# Patient Record
Sex: Female | Born: 2000 | Race: White | Hispanic: Yes | Marital: Single | Smoking: Never smoker
Health system: Southern US, Community
[De-identification: ages and names within clinical notes are randomized; demographics above are authoritative.]

## PROBLEM LIST (undated history)

## (undated) DIAGNOSIS — T7840XA Allergy, unspecified, initial encounter: Secondary | ICD-10-CM

## (undated) HISTORY — DX: Allergy, unspecified, initial encounter: T78.40XA

---
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
## 2020-05-06 ENCOUNTER — Telehealth: Payer: 59 | Admitting: Family Medicine

## 2020-05-06 IMAGING — US US THYROID
1 series · 14 of 25 positions shown · non-contrast
Comparison: 05/27/2017

CLINICAL DATA: Hypothyroid.

EXAM:
THYROID ULTRASOUND
TECHNIQUE: Ultrasound examination of the thyroid gland and adjacent soft
tissues was performed.

[Series 1: us thyroid · 0.07mm/px · 14 of 33 slices shown]
[im 1/33]
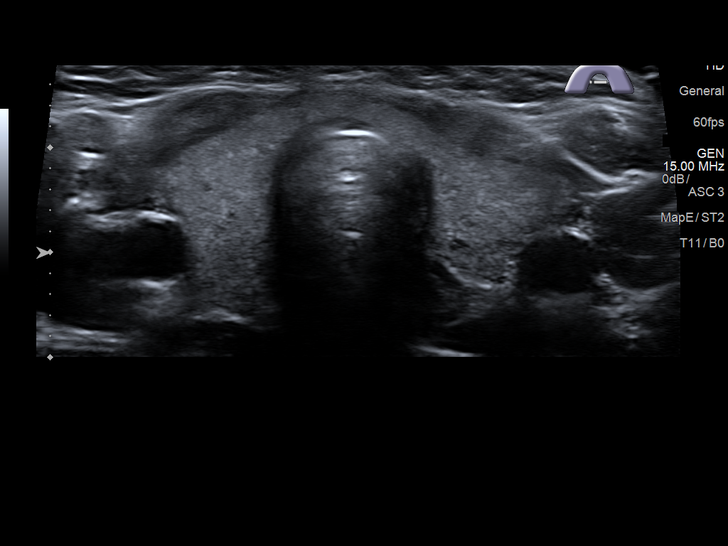
[im 3/33]
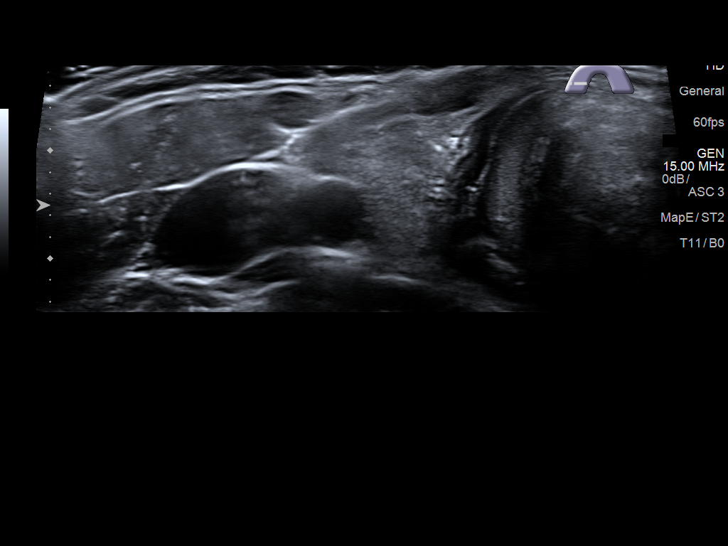
[im 6/33]
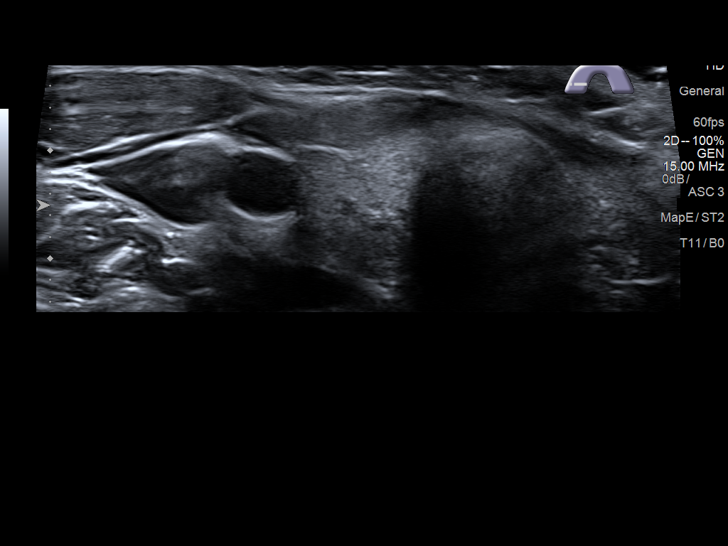
[im 9/33]
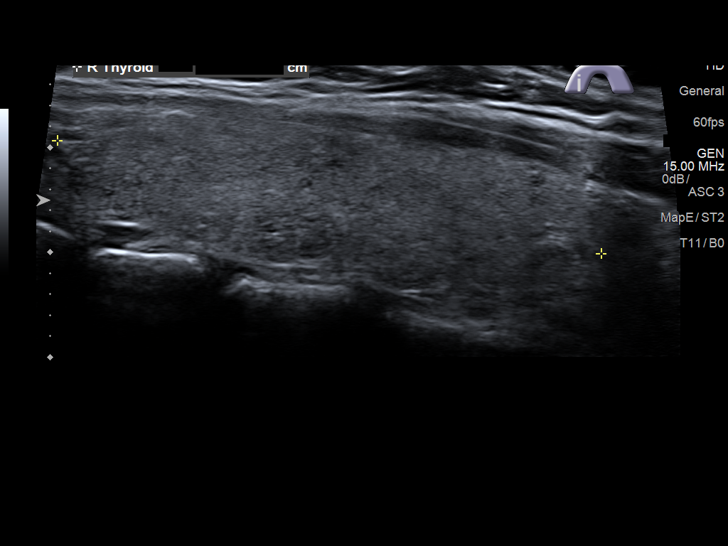
[im 11/33]
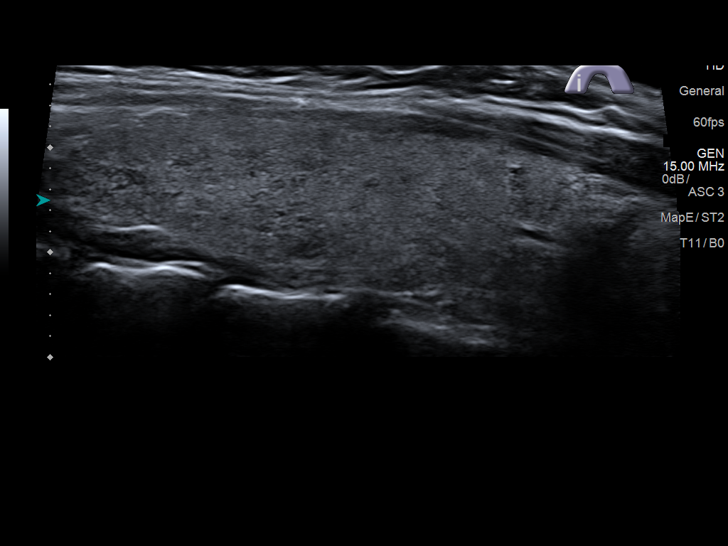
[im 13/33]
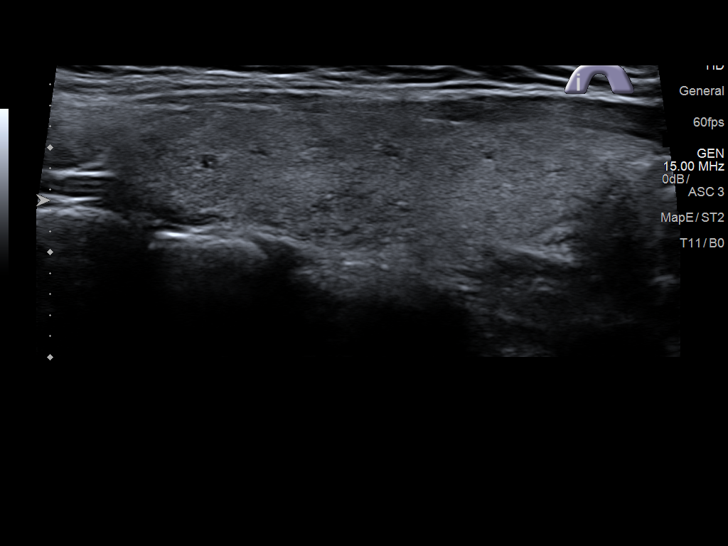
[im 15/33]
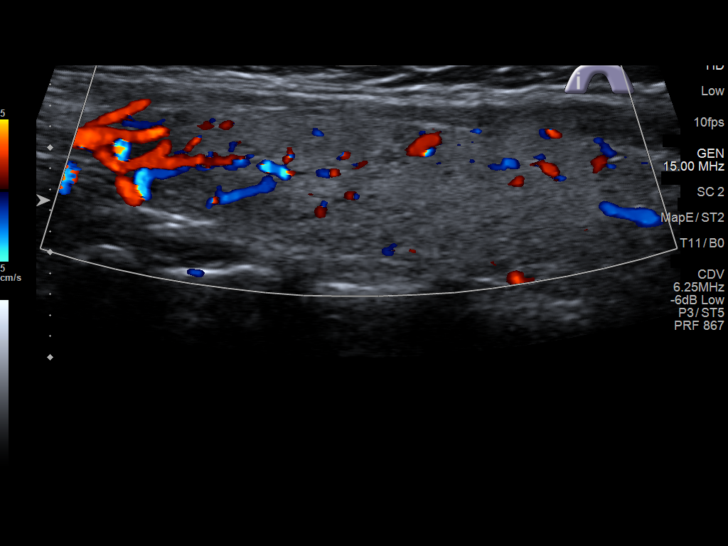
[im 18/33]
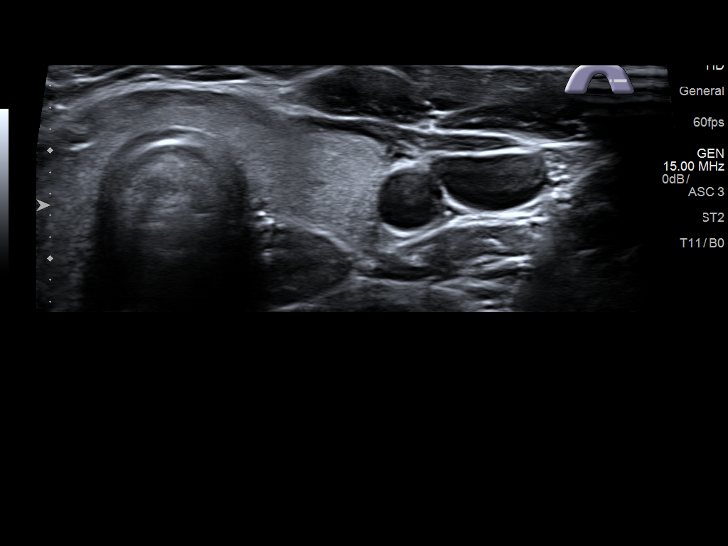
[im 21/33]
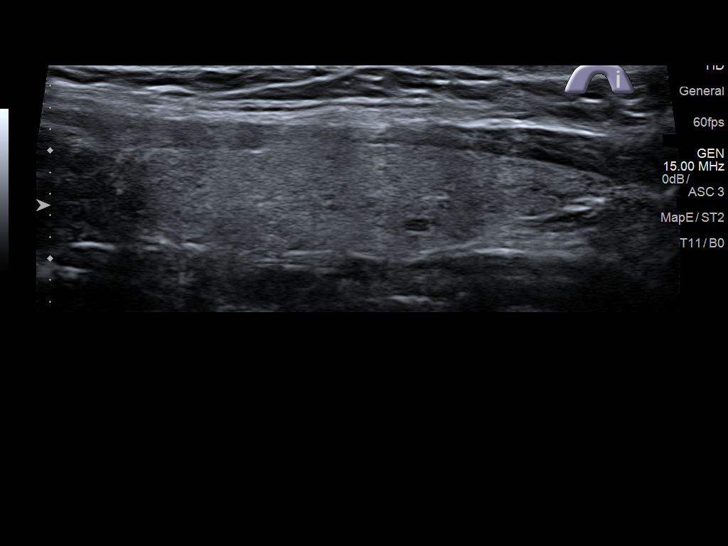
[im 22/33]
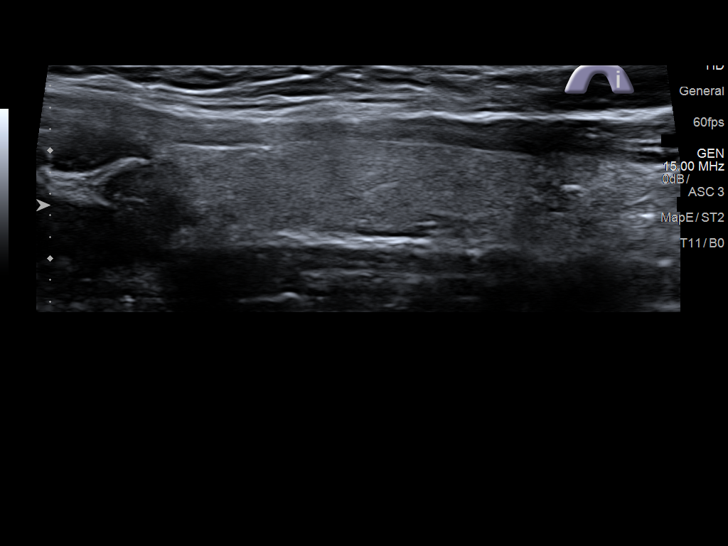
[im 25/33]
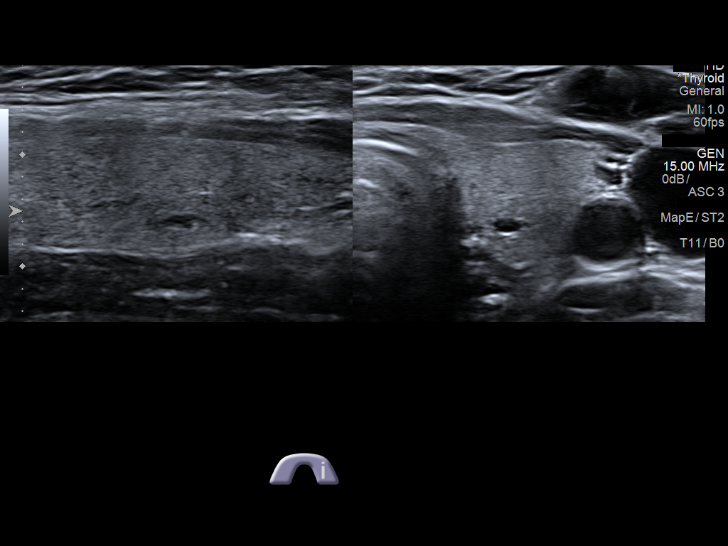
[im 27/33]
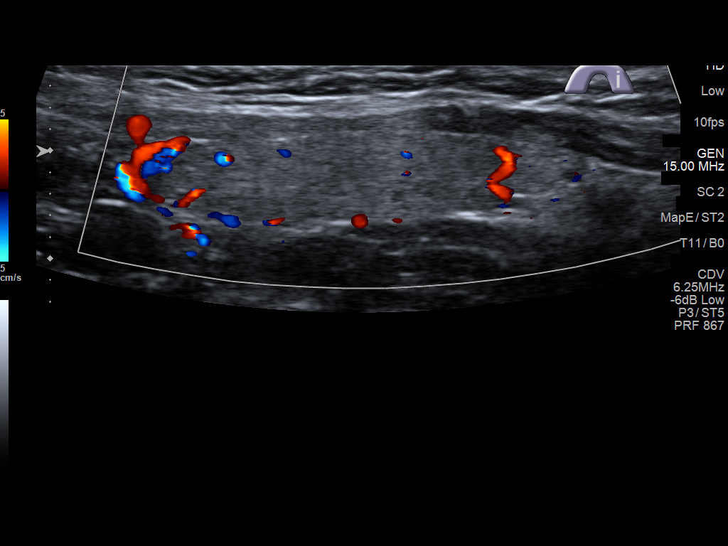
[im 30/33]
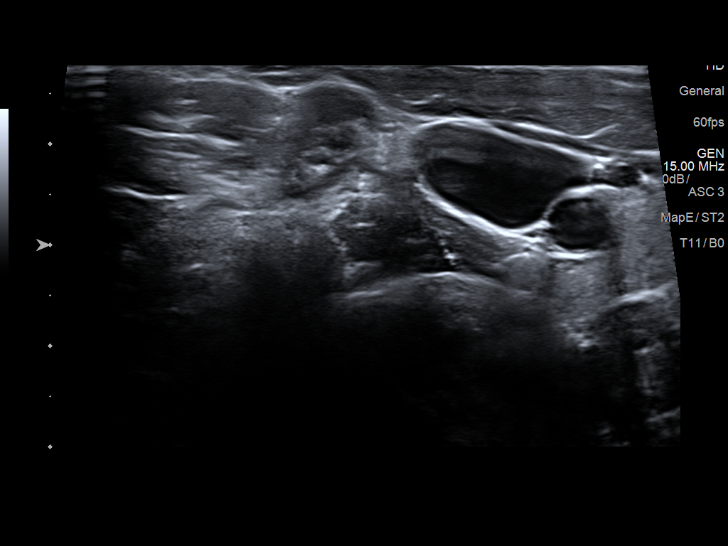
[im 33/33]
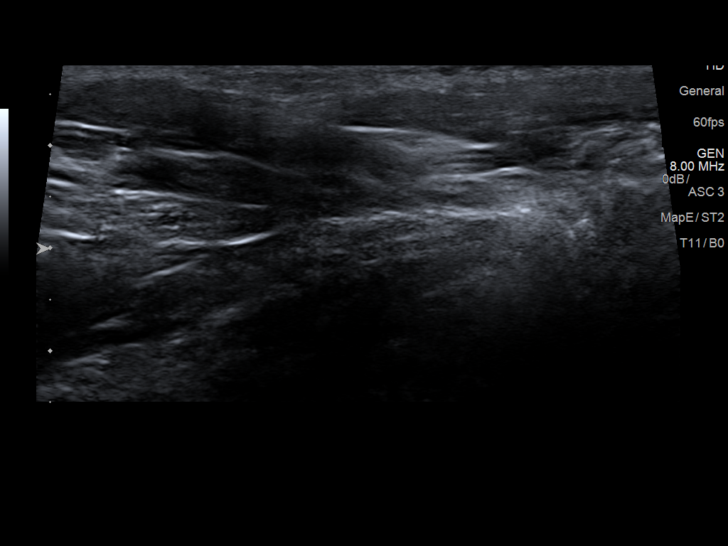

[14 of 25 positions shown; findings below may reference images not displayed]

FINDINGS: Parenchymal Echotexture: Mildly heterogenous

Isthmus: 0.2 cm

Right lobe: 5.3 x 1.7 x 1.3 cm

Left lobe: 4.4 x 1.1 x 1.3 cm

_________________________________________________________

Estimated total number of nodules >/= 1 cm: 0

Number of spongiform nodules >/=  2 cm not described below (TR1): 0

Number of mixed cystic and solid nodules >/= 1.5 cm not described
below (TR2): 0

_________________________________________________________

No discrete nodules are seen within the thyroid gland.
IMPRESSION: Normal thyroid ultrasound.  No focal thyroid nodules.

The above is in keeping with the ACR TI-RADS recommendations - [HOSPITAL] 6423;[DATE].

## 2020-05-09 ENCOUNTER — Telehealth (INDEPENDENT_AMBULATORY_CARE_PROVIDER_SITE_OTHER): Payer: 59 | Admitting: Family Medicine

## 2020-05-09 ENCOUNTER — Encounter: Payer: Self-pay | Admitting: Family Medicine

## 2020-05-09 VITALS — Ht 64.02 in

## 2020-05-09 DIAGNOSIS — N390 Urinary tract infection, site not specified: Secondary | ICD-10-CM

## 2020-05-09 DIAGNOSIS — R300 Dysuria: Secondary | ICD-10-CM | POA: Diagnosis not present

## 2020-05-09 DIAGNOSIS — M545 Low back pain, unspecified: Secondary | ICD-10-CM

## 2020-05-09 LAB — POCT URINALYSIS DIPSTICK
Bilirubin, UA: NEGATIVE
Blood, UA: NEGATIVE
Glucose, UA: NEGATIVE
Ketones, UA: NEGATIVE
Leukocytes, UA: NEGATIVE
Nitrite, UA: NEGATIVE
Protein, UA: NEGATIVE
Spec Grav, UA: 1.01 (ref 1.010–1.025)
Urobilinogen, UA: 0.2 E.U./dL
pH, UA: 6 (ref 5.0–8.0)

## 2020-05-09 MED ORDER — NITROFURANTOIN MONOHYD MACRO 100 MG PO CAPS: 100.0000 mg | ORAL_CAPSULE | Freq: Two times a day (BID) | ORAL | 0 refills | Status: AC

## 2020-05-09 NOTE — Progress Notes (Signed)
Virtual Visit via Video Note I connected with [name] on 05/09/20 by a video enabled telemedicine application and verified that I am speaking with the correct person using two identifiers.  Location patient: home Location provider:work office Persons participating in the virtual visit: patient, provider  I discussed the limitations of evaluation and management by telemedicine and the availability of in person appointments. The patient expressed understanding and agreed to proceed.  Chief Complaint  Patient presents with  . Dysuria    X 2 weeks, only in the morning & soothes down during the day. No fevers.     HPI: Stephanie Morgan is a 20 year old otherwise healthy female with above complaint. Urinary frequency and dysuria at the end of urination are worse in the morning. She has not noted fever,chills,changes in appetite,fatigue,abdominal pain,changes in bowel habits,N/V,vaginal bleeding, or discharge. Negative for urine incontinence and gross hematuria. She has not tried OTC medication.  Hx of recurrent UTI's since middle school. Sexually active. LMP 04/16/20.  Last week she had bilateral lower back pain, now it is on left side. Achy/dull pain, initially it was 8/10, now is 3-4/10. She has not identified exacerbating or alleviating factors. She had not taking OTC medications. No prior history of back pain. No recent injury or unusual activities.  ROS: See pertinent positives and negatives per HPI.  Past Medical History:  Diagnosis Date  . Allergy    No past surgical history on file.  Family History  Problem Relation Age of Onset  . Hypertension Paternal Uncle    Social History   Socioeconomic History  . Marital status: Single    Spouse name: Not on file  . Number of children: Not on file  . Years of education: Not on file  . Highest education level: Not on file  Occupational History  . Not on file  Tobacco Use  . Smoking status: Never Smoker  . Smokeless tobacco:  Never Used  Vaping Use  . Vaping Use: Never used  Substance and Sexual Activity  . Alcohol use: No  . Drug use: No  . Sexual activity: Yes    Birth control/protection: Condom  Other Topics Concern  . Not on file  Social History Narrative  . Not on file   Social Determinants of Health   Financial Resource Strain: Not on file  Food Insecurity: Not on file  Transportation Needs: Not on file  Physical Activity: Not on file  Stress: Not on file  Social Connections: Not on file  Intimate Partner Violence: Not on file   No current outpatient medications on file.  EXAM:  VITALS per patient if applicable:Ht 5' 4.02" (1.626 m)   LMP 04/16/2020   BMI 22.99 kg/m   GENERAL: alert, oriented, appears well and in no acute distress  HEENT: atraumatic, conjunctiva clear, no obvious abnormalities on inspection.  NECK: normal movements of the head and neck  LUNGS: on inspection no signs of respiratory distress, breathing rate appears normal, no obvious gross SOB, gasping or wheezing  CV: no obvious cyanosis  MS: moves all visible extremities without noticeable abnormality  PSYCH/NEURO: pleasant and cooperative, no obvious depression or anxiety, speech and thought processing grossly intact  ASSESSMENT AND PLAN:  Discussed the following assessment and plan:  Dysuria - Plan: POCT urinalysis dipstick, Urine Culture, Urine Culture. We discussed differential Dx. Urine dipstick negative. Ucx ordered and will follow results.  Urinary tract infection without hematuria, site unspecified - Plan: Urine Culture, Urine Culture. Empiric treatment with Macrobid 100 mg bid x  5 d started today. Treatment will be tailored according to Ucx results and susceptibility report. Clearly instructed about warning signs. F/U if symptoms persist.  Acute bilateral low back pain without sciatica I do not think it is related to urinary symptoms. Hx suggest musculoskeletal etiology. Improving. I do not  think imaging is needed at this time. Monitor for new symptoms.  We discussed possible serious and likely etiologies, options for evaluation and workup, limitations of telemedicine visit vs in person visit, treatment, treatment risks and precautions.  I discussed the assessment and treatment plan with the patient. [name] was provided an opportunity to ask questions and all were answered. She agreed with the plan and demonstrated an understanding of the instructions.  Return if symptoms worsen or fail to improve.  Stephanie Campise [place], MD

## 2020-05-11 LAB — URINE CULTURE
MICRO NUMBER:: 11586451
SPECIMEN QUALITY:: ADEQUATE

## 2020-10-06 ENCOUNTER — Other Ambulatory Visit: Payer: Self-pay

## 2020-10-06 ENCOUNTER — Encounter: Payer: Self-pay | Admitting: Nurse Practitioner

## 2020-10-06 ENCOUNTER — Ambulatory Visit (INDEPENDENT_AMBULATORY_CARE_PROVIDER_SITE_OTHER): Payer: 59 | Admitting: Nurse Practitioner

## 2020-10-06 VITALS — BP 116/72 | Ht 63.0 in | Wt 155.0 lb

## 2020-10-06 DIAGNOSIS — Z0000 Encounter for general adult medical examination without abnormal findings: Secondary | ICD-10-CM

## 2020-10-06 DIAGNOSIS — Z01419 Encounter for gynecological examination (general) (routine) without abnormal findings: Secondary | ICD-10-CM

## 2020-10-06 NOTE — Progress Notes (Signed)
   Stephanie Morgan 2000/07/21 [id number]   History:  20 y.o. G0 presents for annual exam without GYN complaints. Monthly cycle/condoms. Sexually active with one partner. Unsure if she has had Gardasil but will find out.   Gynecologic History LMP 09/02/2020 Period Cycle (Days): 30 Period Duration (Days): 4 Period Pattern: Regular Menstrual Flow: Moderate Dysmenorrhea: (!) Moderate Dysmenorrhea Symptoms: Cramping   Contraception/Family planning: none Sexually active: Yes  Health Maintenance Last Pap: Not indicated Last mammogram: Not indicated Last colonoscopy: Not indicated Last Dexa: Not indicated  Past medical history, past surgical history, family history and social history were all reviewed and documented in the EPIC chart. 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   ROS:  A ROS was performed and pertinent positives and negatives are included.  Exam:  Vitals:   10/06/20 1431  BP: 116/72  Weight: 155 lb (70.3 kg)  Height: 5\' 3"  (1.6 m)    Body mass index is 27.46 kg/m.  General appearance:  Normal Thyroid:  Symmetrical, normal in size, without palpable masses or nodularity. Respiratory  Auscultation:  Clear without wheezing or rhonchi Cardiovascular  Auscultation:  Regular rate, without rubs, murmurs or gallops  Edema/varicosities:  Not grossly evident Abdominal  Soft,nontender, without masses, guarding or rebound.  Liver/spleen:  No organomegaly noted  Hernia:  None appreciated  Skin  Inspection:  Grossly normal   Breasts: Not indicated per guidelines Gentitourinary : Deferred  Assessment/Plan:  20 y.o. G0 to establish care and discuss contraception.   Well female exam without gynecological exam - Education provided on SBEs, importance of preventative screenings, current guidelines, high calcium diet, regular exercise, and multivitamin daily. Labs with PCP.   Written material provided on HPV vaccine. She will find out if she has had immunization.   Follow up 1  year for annual     26 [facility], 2:47 PM 10/06/2020

## 2021-05-05 NOTE — Progress Notes (Signed)
ACUTE VISIT Chief Complaint  Patient presents with   Dizziness    Becoming more consistent, happens when bending over now too, used to only be when she was laying down.    HPI: Stephanie Morgan is a 21 y.o. female, who is here today complaining of over a month of dizziness as described above. Problem started while she was in [place],02/2021, mild and lasted a few days and started back a few weeks ago. Described problem as spinning, intermittent, it lasts 5-10 seconds.  Dizziness This is a recurrent problem. The problem has been gradually worsening. Pertinent negatives include no abdominal pain, anorexia, arthralgias, change in bowel habit, chest pain, chills, congestion, coughing, diaphoresis, fatigue, fever, headaches, joint swelling, myalgias, nausea, neck pain, numbness, rash, sore throat, swollen glands, urinary symptoms, visual change, vomiting or weakness. The symptoms are aggravated by twisting and bending. She has tried nothing for the symptoms.  Exacerbated bending down and lying down. Negative for changes in hearing. Chronic tinnitus, unchanged, R>L.  Review of Systems  Constitutional:  Negative for chills, diaphoresis, fatigue and fever.  HENT:  Negative for congestion, mouth sores and sore throat.   Eyes:  Negative for redness and visual disturbance.  Respiratory:  Negative for cough, shortness of breath and wheezing.   Cardiovascular:  Negative for chest pain and palpitations.  Gastrointestinal:  Negative for abdominal pain, anorexia, change in bowel habit, nausea and vomiting.  Endocrine: Negative for cold intolerance and heat intolerance.  Musculoskeletal:  Negative for arthralgias, joint swelling, myalgias and neck pain.  Skin:  Negative for pallor and rash.  Allergic/Immunologic: Positive for environmental allergies.  Neurological:  Positive for dizziness. Negative for weakness, numbness and headaches.  Hematological:  Negative for adenopathy. Does not bruise/bleed  easily.  Rest see pertinent positives and negatives per HPI.  No current outpatient medications on file prior to visit.   No current facility-administered medications on file prior to visit.   Past Medical History:  Diagnosis Date   Allergy    No Known Allergies  Social History   Socioeconomic History   Marital status: Single    Spouse name: Not on file   Number of children: Not on file   Years of education: Not on file   Highest education level: Not on file  Occupational History   Not on file  Tobacco Use   Smoking status: Never   Smokeless tobacco: Never  Vaping Use   Vaping Use: Never used  Substance and Sexual Activity   Alcohol use: No   Drug use: No   Sexual activity: Yes    Birth control/protection: Condom  Other Topics Concern   Not on file  Social History Narrative   Not on file   Social Determinants of Health   Financial Resource Strain: Not on file  Food Insecurity: Not on file  Transportation Needs: Not on file  Physical Activity: Not on file  Stress: Not on file  Social Connections: Not on file   Vitals:   05/08/21 1404  BP: 118/70  Pulse: 77  Resp: 16  SpO2: 97%   Body mass index is 28.17 kg/m.  Physical Exam Vitals and nursing note reviewed.  Constitutional:      General: She is not in acute distress.    Appearance: She is well-developed.  HENT:     Head: Normocephalic and atraumatic.     Right Ear: Tympanic membrane, ear canal and external ear normal.     Left Ear: Tympanic membrane, ear canal and external  ear normal.     Ears:     Comments: Apley maneuver and Dix-Hallpike maneuver positive.     Mouth/Throat:     Mouth: Mucous membranes are moist.     Pharynx: Oropharynx is clear.  Eyes:     Conjunctiva/sclera: Conjunctivae normal.  Neck:     Thyroid: Thyromegaly present. No thyroid mass.  Cardiovascular:     Rate and Rhythm: Normal rate and regular rhythm.     Pulses:          Dorsalis pedis pulses are 2+ on the right  side and 2+ on the left side.     Heart sounds: No murmur heard. Pulmonary:     Effort: Pulmonary effort is normal. No respiratory distress.     Breath sounds: Normal breath sounds.  Abdominal:     Palpations: Abdomen is soft. There is no hepatomegaly or mass.     Tenderness: There is no abdominal tenderness.  Lymphadenopathy:     Cervical: No cervical adenopathy.  Skin:    General: Skin is warm.     Findings: No erythema or rash.  Neurological:     General: No focal deficit present.     Mental Status: She is alert and oriented to person, place, and time.     Cranial Nerves: No cranial nerve deficit.     Gait: Gait normal.     Deep Tendon Reflexes:     Reflex Scores:      Bicep reflexes are 2+ on the right side and 2+ on the left side.      Patellar reflexes are 2+ on the right side and 2+ on the left side. Psychiatric:     Comments: Well groomed, good eye contact.   ASSESSMENT AND PLAN:  Stephanie Morgan was seen today for dizziness.  Diagnoses and all orders for this visit: Orders Placed This Encounter  Procedures   CBC   Basic metabolic panel   TSH   Ambulatory referral to Physical Therapy   Lab Results  Component Value Date   CREATININE 0.72 05/08/2021   BUN 19 05/08/2021   NA 137 05/08/2021   K 4.2 05/08/2021   CL 103 05/08/2021   CO2 29 05/08/2021   Lab Results  Component Value Date   TSH 1.67 05/08/2021   Lab Results  Component Value Date   WBC 7.1 05/08/2021   HGB 13.3 05/08/2021   HCT 39.8 05/08/2021   MCV 91.4 05/08/2021   PLT 302.0 05/08/2021   Benign paroxysmal positional vertigo due to bilateral vestibular disorder Explained that problem can be recurrent. Fall prevention. Vestibular exercises recommended,PT referral placed. Meclizine 25 mg tid prn, some side effects discussed. Instructed about warning signs. F/U as needed.  -     meclizine (ANTIVERT) 25 MG tablet; Take 1 tablet (25 mg total) by mouth 3 (three) times daily as needed for  dizziness.  Dizziness Differential Dx's discussed. Hx and examination suggest benign vertigo. Instructed about warning signs.  Tinnitus, bilateral Chronic. Most likely benign. We discussed prognosis. Monitor for changes.  Enlarged thyroid gland Mild, no thyroid masses on examination. Thyroid US 01/2018 showed normal thyroid. Further recommendations according to TSH result.  Return if symptoms worsen or fail to improve.  Chang Tiggs G. [place], MD  [facility]. [place] office.

## 2021-05-08 ENCOUNTER — Ambulatory Visit (INDEPENDENT_AMBULATORY_CARE_PROVIDER_SITE_OTHER): Payer: PRIVATE HEALTH INSURANCE | Admitting: Family Medicine

## 2021-05-08 ENCOUNTER — Encounter: Payer: Self-pay | Admitting: Family Medicine

## 2021-05-08 VITALS — BP 118/70 | HR 77 | Resp 16 | Ht 63.0 in | Wt 159.0 lb

## 2021-05-08 DIAGNOSIS — E049 Nontoxic goiter, unspecified: Secondary | ICD-10-CM

## 2021-05-08 DIAGNOSIS — R42 Dizziness and giddiness: Secondary | ICD-10-CM

## 2021-05-08 DIAGNOSIS — H8113 Benign paroxysmal vertigo, bilateral: Secondary | ICD-10-CM | POA: Diagnosis not present

## 2021-05-08 DIAGNOSIS — H9313 Tinnitus, bilateral: Secondary | ICD-10-CM

## 2021-05-08 LAB — BASIC METABOLIC PANEL
BUN: 19 mg/dL (ref 6–23)
CO2: 29 mEq/L (ref 19–32)
Calcium: 9.7 mg/dL (ref 8.4–10.5)
Chloride: 103 mEq/L (ref 96–112)
Creatinine, Ser: 0.72 mg/dL (ref 0.40–1.20)
GFR: 120.07 mL/min (ref >60.00)
Glucose, Bld: 94 mg/dL (ref 70–99)
Potassium: 4.2 mEq/L (ref 3.5–5.1)
Sodium: 137 mEq/L (ref 135–145)

## 2021-05-08 LAB — CBC
HCT: 39.8 % (ref 36.0–46.0)
Hemoglobin: 13.3 g/dL (ref 12.0–15.0)
MCHC: 33.5 g/dL (ref 30.0–36.0)
MCV: 91.4 fl (ref 78.0–100.0)
Platelets: 302 10*3/uL (ref 150.0–400.0)
RBC: 4.35 Mil/uL (ref 3.87–5.11)
RDW: 12.7 % (ref 11.5–14.6)
WBC: 7.1 10*3/uL (ref 4.5–10.5)

## 2021-05-08 MED ORDER — MECLIZINE HCL 25 MG PO TABS: 25.0000 mg | ORAL_TABLET | Freq: Three times a day (TID) | ORAL | 0 refills | Status: DC | PRN

## 2021-05-08 NOTE — Patient Instructions (Addendum)
A few things to remember from today's visit:  Dizziness - Plan: CBC, Basic metabolic panel  Benign paroxysmal positional vertigo due to bilateral vestibular disorder - Plan: Ambulatory referral to Physical Therapy, meclizine (ANTIVERT) 25 MG tablet  Do not use My Chart to request refills or for acute issues that need immediate attention.    Dizziness is a perception of movement, it is sometimes difficult to describe and can be  caused by different problems, most benign but others can be life threaten.  Vertigo is the most common cause of dizziness, usually related with inner ear and can be associated with nausea, vomiting, and unbalance sensation. It can be complicated by falls due to lose of balance; so fall precautions are very important.  Most of the time dizziness is benign, usually intermittent, last a few seconds at the time and aggravated by certain positions. It usually resolves in a few weeks without residual effect but it could be recurrent.  Sometimes blood work is ordered to evaluate for other possible causes.  Dizziness can also be caused by certain medications, dehydration, migraines, and strokes.  Medication prescribed for vertigo, Meclizine, causes drowsiness/sleepiness, so frequently I recommended taking it at bedtime.  Seek immediate medical attention if: New severe headache, dobble vision, fever (100 F or more), associated numbness/tingling, focal weakness, persistent vomiting, not able to walk, or sudden worsening symptoms.  Please be sure medication list is accurate. If a new problem present, please set up appointment sooner than planned today.  Benign Positional Vertigo Vertigo is the feeling that you or your surroundings are moving when they are not. Benign positional vertigo is the most common form of vertigo. This is usually a harmless condition (benign). This condition is positional. This means that symptoms are triggered by certain movements and positions. This  condition can be dangerous if it occurs while you are doing something that could cause harm to yourself or others. This includes activities such as driving or operating machinery. What are the causes? The inner ear has fluid-filled canals that help your brain sense movement and balance. When the fluid moves, the brain receives messages about your body's position. With benign positional vertigo, calcium crystals in the inner ear break free and disturb the inner ear area. This causes your brain to receive confusing messages about your body's position. What increases the risk? You are more likely to develop this condition if: You are a woman. You are 34 years of age or older. You have recently had a head injury. You have an inner ear disease. What are the signs or symptoms? Symptoms of this condition usually happen when you move your head or your eyes in different directions. Symptoms may start suddenly and usually last for less than a minute. They include: Loss of balance and falling. Feeling like you are spinning or moving. Feeling like your surroundings are spinning or moving. Nausea and vomiting. Blurred vision. Dizziness. Involuntary eye movement (nystagmus). Symptoms can be mild and cause only minor problems, or they can be severe and interfere with daily life. Episodes of benign positional vertigo may return (recur) over time. Symptoms may also improve over time. How is this diagnosed? This condition may be diagnosed based on: Your medical history. A physical exam of the head, neck, and ears. Positional tests to check for or stimulate vertigo. You may be asked to turn your head and change positions, such as going from sitting to lying down. A health care provider will watch for symptoms of vertigo. You may  be referred to a health care provider who specializes in ear, nose, and throat problems (ENT or otolaryngologist) or a provider who specializes in disorders of the nervous system  (neurologist). How is this treated? This condition may be treated in a session in which your health care provider moves your head in specific positions to help the displaced crystals in your inner ear move. Treatment for this condition may take several sessions. Surgery may be needed in severe cases, but this is rare. In some cases, benign positional vertigo may resolve on its own in 2-4 weeks. Follow these instructions at home: Safety Move slowly. Avoid sudden body or head movements or certain positions, as told by your health care provider. Avoid driving or operating machinery until your health care provider says it is safe. Avoid doing any tasks that would be dangerous to you or others if vertigo occurs. If you have trouble walking or keeping your balance, try using a cane for stability. If you feel dizzy or unstable, sit down right away. Return to your normal activities as told by your health care provider. Ask your health care provider what activities are safe for you. General instructions Take over-the-counter and prescription medicines only as told by your health care provider. Drink enough fluid to keep your urine pale yellow. Keep all follow-up visits. This is important. Contact a health care provider if: You have a fever. Your condition gets worse or you develop new symptoms. Your family or friends notice any behavioral changes. You have nausea or vomiting that gets worse. You have numbness or a prickling and tingling sensation. Get help right away if you: Have difficulty speaking or moving. Are always dizzy or faint. Develop severe headaches. Have weakness in your legs or arms. Have changes in your hearing or vision. Develop a stiff neck. Develop sensitivity to light. These symptoms may represent a serious problem that is an emergency. Do not wait to see if the symptoms will go away. Get medical help right away. Call your local emergency services (911 in the [place] Do not  drive yourself to the hospital. Summary Vertigo is the feeling that you or your surroundings are moving when they are not. Benign positional vertigo is the most common form of vertigo. This condition is caused by calcium crystals in the inner ear that become displaced. This causes a disturbance in an area of the inner ear that helps your brain sense movement and balance. Symptoms include loss of balance and falling, feeling that you or your surroundings are moving, nausea and vomiting, and blurred vision. This condition can be diagnosed based on symptoms, a physical exam, and positional tests. Follow safety instructions as told by your health care provider and keep all follow-up visits. This is important. This information is not intended to replace advice given to you by your health care provider. Make sure you discuss any questions you have with your health care provider. Document Revised: 01/27/2020 Document Reviewed: 01/27/2020 Elsevier Patient Education  2022 [employer].

## 2021-05-09 LAB — TSH: TSH: 1.67 u[IU]/mL (ref 0.35–5.50)

## 2021-05-16 ENCOUNTER — Encounter: Payer: Self-pay | Admitting: Physical Therapy

## 2021-05-16 ENCOUNTER — Other Ambulatory Visit: Payer: Self-pay

## 2021-05-16 ENCOUNTER — Ambulatory Visit (INDEPENDENT_AMBULATORY_CARE_PROVIDER_SITE_OTHER): Payer: PRIVATE HEALTH INSURANCE | Admitting: Physical Therapy

## 2021-05-16 DIAGNOSIS — R42 Dizziness and giddiness: Secondary | ICD-10-CM

## 2021-05-16 DIAGNOSIS — H8113 Benign paroxysmal vertigo, bilateral: Secondary | ICD-10-CM | POA: Diagnosis not present

## 2021-05-16 NOTE — Therapy (Signed)
?OUTPATIENT PHYSICAL THERAPY VESTIBULAR EVALUATION ? ? ? ? ?Patient Name: Stephanie Morgan ?MRN: [id number] ?DOB:2000/04/28, 21 y.o., female ?Today's Date: 05/16/2021 ? ?PCP: [place], Betty G, MD ?REFERRING PROVIDER: [place], Betty G, MD ? ? PT End of Session - 05/16/21 1033   ? ? Visit Number 1   ? Number of Visits 4   ? Date for PT Re-Evaluation 06/13/21   ? PT Start Time [phone]   ? PT Stop Time 1023   ? PT Time Calculation (min) 35 min   ? Activity Tolerance Patient tolerated treatment well   ? Behavior During Therapy [facility] for tasks assessed/performed   ? ?  ?  ? ?  ? ? ?Past Medical History:  ?Diagnosis Date  ? Allergy   ? ?History reviewed. No pertinent surgical history. ?Patient Active Problem List  ? Diagnosis Date Noted  ? Allergic rhinitis 07/07/2012  ? ? ?ONSET DATE: 12/22 ? ?REFERRING DIAG: H81.13 (ICD-10-CM) - Benign paroxysmal positional vertigo due to bilateral vestibular disorder  ? ?THERAPY DIAG:  ?BPPV (benign paroxysmal positional vertigo), bilateral - Plan: PT plan of care cert/re-cert ? ?Dizziness and giddiness - Plan: PT plan of care cert/re-cert ? ?SUBJECTIVE:  ? ?SUBJECTIVE STATEMENT: ?Pt reports dizziness when lying down or leaning over onto hand when sitting in class.  She reports more frequent dizziness.  No known injury that caused sudden onset.  She also reports intermittent tinnitus. ? ?Pt accompanied by: self ? ?PERTINENT HISTORY: none ? ? ?PAIN:  ?Are you having pain? No ? ?PRECAUTIONS: None ? ?WEIGHT BEARING RESTRICTIONS No ? ?FALLS: Has patient fallen in last 6 months? No, Number of falls: n/a ? ?LIVING ENVIRONMENT: ?Lives with: lives with their family (79 y/o brother) ? ? ?PLOF: Independent, Vocation/Vocational requirements: works at [employer], in school at [employer] and family studies (pre-OT), and Leisure: spend time with friends, exercise (running, lifting), read books, watch TV ? ?PATIENT GOALS no dizziness ? ?OBJECTIVE:  ? ?COGNITION: ?Overall cognitive status: Within  functional limits for tasks assessed ? ?Cervical AROM/PROM: WNL ? ?VESTIBULAR ASSESSMENT ? ? GENERAL OBSERVATION: no symptoms at rest ?  ? SYMPTOM BEHAVIOR: ?  Subjective history: see above ?  Non-Vestibular symptoms: tinnitus and reports some occasional trouble with visual acuity ?  Type of dizziness: Spinning/Vertigo ?  Frequency: most times when lying down, at least 1x/day ?  Duration: 5 sec ?  Aggravating factors: Induced by position change: lying supine ?  Relieving factors:  suddenly stops ?  Progression of symptoms: worse ? ? OCULOMOTOR EXAM: ?  Ocular Alignment: normal ?  Ocular ROM: No Limitations ?  Spontaneous Nystagmus: absent ?  Gaze-Induced Nystagmus: absent ?  Smooth Pursuits: intact ?  Saccades: intact ?   ? ?  ? VESTIBULAR - OCULAR REFLEX:  ?  Slow VOR: Normal ?  Head-Impulse Test: HIT Right: negative ?HIT Left: negative ?   ?  ? POSITIONAL TESTING: Right Dix-Hallpike: none; Duration:5 sec of symptoms ?Left Dix-Hallpike: none; Duration: < 2 sec ?Right Roll Test: none; Duration: none ?Left Roll Test: none; Duration: none ?Right Sidelying: none; Duration: 5 sec of symptoms ?Left Sidelying: none; Duration: none ?  ?OTHOSTATICS: not done ? ?FUNCTIONAL GAIT:  pt independent with mobility ? ? ?VESTIBULAR TREATMENT: ? ?Canalith Repositioning: ?  Epley Right: Number of Reps: 1 ?Gaze Adaptation: ?  x1 Viewing Horizontal: Comment: instructed for home and x1 Viewing Vertical:  Comment: instructed for home ?Habituation: ?  Brandt-Daroff: comment: instructed for home ?Other: n/a ? ?PATIENT EDUCATION: ?  Education details: HEP, BPPV and Meniere's Disease as possible differential ?Person educated: Patient ?Education method: Explanation, Demonstration, and Handouts ?Education comprehension: verbalized understanding ? ?HEP ?Access Code: [email or web address] ?URL: [email or web address] ?Date: 05/16/2021 ?Prepared by: Moshe Cipro ? ?Exercises ?Brandt-Daroff Vestibular Exercise - 2 x daily - 7 x weekly - 1 sets  - 3-5 reps ?Standing Gaze Stabilization with Head Rotation - 2 x daily - 7 x weekly - 1 sets - 30 seconds ?Standing Gaze Stabilization with Head Nod - 2 x daily - 7 x weekly - 1 reps - 30 seconds ? ? ?ASSESSMENT: ? ?CLINICAL IMPRESSION: ?Patient is a 21 y.o. female who was seen today for physical therapy evaluation and treatment for vertigo.  Subjective consistent with BPPV, however unable to provoke nystagmus today likely due to meclizine use.  She is mildly symptomatic with Rt side testing so treated with Rt epley's today.  Differential can include Meniere's Disease as she report tinnitus and intermittent episodes of dizziness.  Recommended trial of low Na+ diet to see if this helps.  ? ? ?OBJECTIVE IMPAIRMENTS dizziness.  ? ?ACTIVITY LIMITATIONS school and sleeping .  ? ?PERSONAL FACTORS  No personal factors  are also affecting patient's functional outcome.  ? ? ?REHAB POTENTIAL: Excellent ? ?CLINICAL DECISION MAKING: Stable/uncomplicated ? ?EVALUATION COMPLEXITY: Low ? ? ?GOALS: ?Goals reviewed with patient? Yes ? ?SHORT TERM GOALS: ? ?To be determined if pt returns ?LONG TERM GOALS: ? ?To be determined if pt returns ?PLAN: ?PT FREQUENCY: 1x/week; will see PRN up to 1x/wk ? ?PT DURATION: 4 weeks ? ?PLANNED INTERVENTIONS: Therapeutic exercises, Therapeutic activity, Neuromuscular re-education, Balance training, Patient/Family education, Vestibular training, Canalith repositioning, Cryotherapy, Moist heat, Taping, and Manual therapy ? ?PLAN FOR NEXT SESSION: if pt returns, reassess canals and tx as indicated ? ? ?Clarita Crane, PT, DPT ?05/16/21 10:37 AM ? ? ?

## 2021-09-25 NOTE — Progress Notes (Signed)
ACUTE VISIT Chief Complaint  Patient presents with   lump on breast    Noticed the lump on Sunday, pain & itchiness started Friday. Left side.    HPI: Stephanie Morgan is a 21 y.o. female, who is here today complaining of 4 days of left breast skin pruritus then noted small tender lump around nipple. Problem is constant. He has not tried OTC treatments.  Clear nipple discharge one 4 days ago. Negative for fever, night sweats, abnormal weight loss, CP, cough, wheezing,or skin rash. LMP a week ago, normal. Sexually active, not on OCP's.  Problem seems stable. FHx negative for ovarian or breast cancer.  Lab Results  Component Value Date   TSH 1.67 05/08/2021   Review of Systems  Constitutional:  Negative for activity change, appetite change, chills, fever and unexpected weight change.  HENT:  Negative for sore throat and trouble swallowing.   Respiratory:  Negative for cough and shortness of breath.   Cardiovascular:  Negative for chest pain.  Neurological:  Negative for syncope and numbness.  Hematological:  Negative for adenopathy. Does not bruise/bleed easily.  Rest see pertinent positives and negatives per HPI.  Current Outpatient Medications on File Prior to Visit  Medication Sig Dispense Refill   meclizine (ANTIVERT) 25 MG tablet Take 1 tablet (25 mg total) by mouth 3 (three) times daily as needed for dizziness. 30 tablet 0   No current facility-administered medications on file prior to visit.   Past Medical History:  Diagnosis Date   Allergy    No Known Allergies  Social History   Socioeconomic History   Marital status: Single    Spouse name: Not on file   Number of children: Not on file   Years of education: Not on file   Highest education level: Not on file  Occupational History   Not on file  Tobacco Use   Smoking status: Never   Smokeless tobacco: Never  Vaping Use   Vaping Use: Never used  Substance and Sexual Activity   Alcohol use: No   Drug use:  No   Sexual activity: Yes    Birth control/protection: Condom  Other Topics Concern   Not on file  Social History Narrative   Not on file   Social Determinants of Health   Financial Resource Strain: Not on file  Food Insecurity: Not on file  Transportation Needs: Not on file  Physical Activity: Not on file  Stress: Not on file  Social Connections: Not on file   Vitals:   09/26/21 1027  BP: 110/70  Resp: 12   Body mass index is 29.96 kg/m.  Physical Exam Vitals and nursing note reviewed.  Constitutional:      General: She is not in acute distress.    Appearance: She is well-developed.  HENT:     Head: Atraumatic.  Eyes:     Conjunctiva/sclera: Conjunctivae normal.  Pulmonary:     Effort: Pulmonary effort is normal. No respiratory distress.     Breath sounds: Normal breath sounds.  Chest:     Chest wall: No tenderness or edema.  Breasts:    Right: No swelling, inverted nipple, nipple discharge, skin change or tenderness.     Left: No swelling, inverted nipple, nipple discharge, skin change or tenderness.       Comments: Breast fibrocystic like changes , L>R, outer quadrants. Lymphadenopathy:     Cervical: No cervical adenopathy.     Upper Body:     Right upper body: No  supraclavicular or axillary adenopathy.     Left upper body: No supraclavicular or axillary adenopathy.  Skin:    General: Skin is warm.     Findings: No erythema or rash.  Neurological:     Mental Status: She is alert and oriented to person, place, and time.  Psychiatric:        Speech: Speech normal.     Comments: Well groomed, good eye contact.   ASSESSMENT AND PLAN:  Stephanie Morgan was seen today for lump on breast.  Diagnoses and all orders for this visit: Orders Placed This Encounter  Procedures   [place] Unlisted Procedure Breast   Breast lump in female We discussed possible etiologies.  He does not seem to be an infectious process. History and examination do not suggest a serious problem.   We could monitor for now, she is anxious about possible serious process. Will try to obtain an [place] around affected area to determine possible causes. Monitor for new symptoms.  Fibrocystic breast disease (FCBD), unspecified laterality Educated about diagnosis, prognosis, and treatment options.  I spent a total of 30 minutes in both face to face and non face to face activities for this visit on the date of this encounter. During this time history was obtained and documented, examination was performed, prior labs reviewed, and assessment/plan discussed.  Return if symptoms worsen or fail to improve.   Stephanie Gritz G. [place], MD  [facility]. [place] office.

## 2021-09-26 ENCOUNTER — Ambulatory Visit (INDEPENDENT_AMBULATORY_CARE_PROVIDER_SITE_OTHER): Payer: PRIVATE HEALTH INSURANCE | Admitting: Family Medicine

## 2021-09-26 ENCOUNTER — Encounter: Payer: Self-pay | Admitting: Family Medicine

## 2021-09-26 VITALS — BP 110/70 | Resp 12 | Ht 63.0 in | Wt 169.1 lb

## 2021-09-26 DIAGNOSIS — N63 Unspecified lump in unspecified breast: Secondary | ICD-10-CM | POA: Diagnosis not present

## 2021-09-26 DIAGNOSIS — N6019 Diffuse cystic mastopathy of unspecified breast: Secondary | ICD-10-CM | POA: Diagnosis not present

## 2021-09-26 NOTE — Patient Instructions (Addendum)
A few things to remember from today's visit:  Breast lump in female - Plan: [place] Unlisted Procedure Breast  Fibrocystic breast disease (FCBD), unspecified laterality  If you need refills please call your pharmacy. Do not use My Chart to request refills or for acute issues that need immediate attention.   Continue monitoring for now. Will try to arrange an ultrasound. If persistent or new problems present,please arrange an appt with your gynecologist.  Please be sure medication list is accurate. If a new problem present, please set up appointment sooner than planned today.

## 2021-10-02 ENCOUNTER — Telehealth: Payer: Self-pay | Admitting: Family Medicine

## 2021-10-02 DIAGNOSIS — Z111 Encounter for screening for respiratory tuberculosis: Secondary | ICD-10-CM

## 2021-10-02 NOTE — Telephone Encounter (Signed)
I called and spoke with patient. She is aware that she hasn't had a TB test as far as I can see in her chart. Patient will come in tomorrow to have the TB gold blood draw.

## 2021-10-02 NOTE — Addendum Note (Signed)
Addended by: Weyman Croon E on: 10/02/2021 01:00 PM   Modules accepted: Orders

## 2021-10-02 NOTE — Telephone Encounter (Signed)
Pt is wondering if she has had a TB test before.  She is requesting a call back.

## 2021-10-03 ENCOUNTER — Other Ambulatory Visit (INDEPENDENT_AMBULATORY_CARE_PROVIDER_SITE_OTHER): Payer: PRIVATE HEALTH INSURANCE

## 2021-10-03 DIAGNOSIS — Z111 Encounter for screening for respiratory tuberculosis: Secondary | ICD-10-CM

## 2021-10-07 LAB — QUANTIFERON-TB GOLD PLUS
Mitogen-NIL: >10 IU/mL
NIL: 0.04 IU/mL
QuantiFERON-TB Gold Plus: NEGATIVE
TB1-NIL: 0.02 IU/mL
TB2-NIL: 0.03 IU/mL

## 2021-10-11 ENCOUNTER — Ambulatory Visit
Admission: RE | Admit: 2021-10-11 | Discharge: 2021-10-11 | Disposition: A | Discharge status: Home / Self Care | Payer: PRIVATE HEALTH INSURANCE | Source: Ambulatory Visit | Attending: Family Medicine | Admitting: Family Medicine

## 2021-10-11 DIAGNOSIS — N63 Unspecified lump in unspecified breast: Secondary | ICD-10-CM

## 2021-10-11 NOTE — Telephone Encounter (Signed)
I called and spoke with patient. She is aware that TB test is negative. Copy placed up front for her.

## 2021-10-11 NOTE — Telephone Encounter (Signed)
Pt is calling and would like blood TB test result

## 2022-03-23 ENCOUNTER — Emergency Department (HOSPITAL_COMMUNITY)
Admission: EM | Admit: 2022-03-23 | Discharge: 2022-03-24 | Disposition: A | Discharge status: Home / Self Care | Payer: PRIVATE HEALTH INSURANCE | Attending: Emergency Medicine | Admitting: Emergency Medicine

## 2022-03-23 ENCOUNTER — Other Ambulatory Visit: Payer: Self-pay

## 2022-03-23 ENCOUNTER — Encounter (HOSPITAL_COMMUNITY): Payer: Self-pay | Admitting: *Deleted

## 2022-03-23 DIAGNOSIS — R300 Dysuria: Secondary | ICD-10-CM | POA: Diagnosis present

## 2022-03-23 DIAGNOSIS — N12 Tubulo-interstitial nephritis, not specified as acute or chronic: Secondary | ICD-10-CM | POA: Diagnosis not present

## 2022-03-23 LAB — URINALYSIS, ROUTINE W REFLEX MICROSCOPIC
Bilirubin Urine: NEGATIVE
Glucose, UA: NEGATIVE mg/dL
Hgb urine dipstick: NEGATIVE
Ketones, ur: NEGATIVE mg/dL
Leukocytes,Ua: NEGATIVE
Nitrite: POSITIVE — AB
Protein, ur: NEGATIVE mg/dL
Specific Gravity, Urine: 1.006 (ref 1.005–1.030)
pH: 5 (ref 5.0–8.0)

## 2022-03-23 NOTE — ED Provider Triage Note (Signed)
Emergency Medicine Provider Triage Evaluation Note  Jhoselyn Ruffini , a 22 y.o. female  was evaluated in triage.  Pt complains of dysuria, urinary frequency, she had some brief flank pain earlier today, reports that flank pain only lasted for minutes, did not feel severe in nature.  Patient reports she has had no previous history of kidney stones.  Patient reports that she took some cranberry pills and AZO which change the color of her urine, reports that she has never taken the pills before.  Patient denies any vaginal discharge, vaginal bleeding.  She reports LMP earlier this month 1/2 to 1/5 and was normal for her.  Review of Systems  Positive: Dysuria, hematuria Negative: Vaginal discharge, bleeding, nausea, vomiting  Physical Exam  There were no vitals taken for this visit. Gen:   Awake, no distress   Resp:  Normal effort  MSK:   Moves extremities without difficulty  Other:  Minimal tenderness to palpation suprapubically, no CVA tenderness bilaterally  Medical Decision Making  Medically screening exam initiated at 8:06 PM.  Appropriate orders placed.  Carmelle Bamberg was informed that the remainder of the evaluation will be completed by another provider, this initial triage assessment does not replace that evaluation, and the importance of remaining in the ED until their evaluation is complete.  Workup initiated   Dorien Chihuahua 03/23/22 2007

## 2022-03-23 NOTE — ED Triage Notes (Signed)
The pt thinks she has a uti since yesterday  some blood in her urine some flank pain  no temp  lmp jab 5th

## 2022-03-24 LAB — PREGNANCY, URINE: Preg Test, Ur: NEGATIVE

## 2022-03-24 MED ORDER — CEPHALEXIN 500 MG PO CAPS: 500.0000 mg | ORAL_CAPSULE | Freq: Four times a day (QID) | ORAL | 0 refills | Status: AC

## 2022-03-24 MED ORDER — SODIUM CHLORIDE 0.9 % IV SOLN
1.0000 g | Freq: Once | INTRAVENOUS | Status: AC
  Administered 2022-03-24: 1 g via INTRAVENOUS
  Filled 2022-03-24: qty 10

## 2022-03-24 MED ORDER — ACETAMINOPHEN 500 MG PO TABS
1000.0000 mg | ORAL_TABLET | ORAL | Status: AC
  Administered 2022-03-24: 1000 mg via ORAL
  Filled 2022-03-24: qty 2

## 2022-03-24 NOTE — ED Provider Notes (Signed)
[facility] EMERGENCY DEPARTMENT Provider Note   CSN: [id number] Arrival date & time: 03/23/22  1846     History  Chief Complaint  Patient presents with   possible uti    Stephanie Morgan is a 22 y.o. female.  22 year old female with history of UTIs presents emergency department with dysuria and flank pain.  Says that several days ago started experiencing dysuria.  Says that she also has started experiencing lower back pain.  Denies any fevers or vomiting.  Says that the low back pain is midline and not worse on one side or the other and is a dull aching sensation that is 7 out of 10 in severity when at its worst.  No exacerbating or alleviating factors.  No history of kidney stones.  Tried over-the-counter medication for her dysuria and pain which did not improve her symptoms.       Home Medications Prior to Admission medications   Medication Sig Start Date End Date Taking? Authorizing Provider  cephALEXin (KEFLEX) 500 MG capsule Take 1 capsule (500 mg total) by mouth 4 (four) times daily for 10 days. 03/24/22 04/03/22 Yes Fransico Meadow, MD  meclizine (ANTIVERT) 25 MG tablet Take 1 tablet (25 mg total) by mouth 3 (three) times daily as needed for dizziness. 05/08/21   [place], Betty G, MD      Allergies    Patient has no known allergies.    Review of Systems   Review of Systems  Physical Exam Updated Vital Signs BP (!) 106/54 (BP Location: Left Arm)   Pulse 67   Temp 98.3 F (36.8 C) (Oral)   Resp 17   Ht 5\' 3"  (1.6 m)   Wt 76.7 kg   SpO2 99%   BMI 29.95 kg/m  Physical Exam Vitals and nursing note reviewed.  Constitutional:      General: She is not in acute distress.    Appearance: She is well-developed.  HENT:     Head: Normocephalic and atraumatic.     Right Ear: External ear normal.     Left Ear: External ear normal.     Nose: Nose normal.  Eyes:     Extraocular Movements: Extraocular movements intact.     Conjunctiva/sclera: Conjunctivae  normal.     Pupils: Pupils are equal, round, and reactive to light.  Pulmonary:     Effort: Pulmonary effort is normal. No respiratory distress.  Abdominal:     General: Abdomen is flat. There is no distension.     Palpations: Abdomen is soft. There is no mass.     Tenderness: There is abdominal tenderness (Suprapubic). There is left CVA tenderness. There is no right CVA tenderness or guarding.  Musculoskeletal:     Cervical back: Normal range of motion and neck supple.     Right lower leg: No edema.     Left lower leg: No edema.  Skin:    General: Skin is warm and dry.  Neurological:     Mental Status: She is alert and oriented to person, place, and time. Mental status is at baseline.  Psychiatric:        Mood and Affect: Mood normal.     ED Results / Procedures / Treatments   Labs (all labs ordered are listed, but only abnormal results are displayed) Labs Reviewed  URINE CULTURE - Abnormal; Notable for the following components:      Result Value   Culture   (*)    Value: >=100,000 COLONIES/mL GRAM  NEGATIVE RODS IDENTIFICATION AND SUSCEPTIBILITIES TO FOLLOW Performed at [facility], [place] [address], [place], [place] [address]    All other components within normal limits  URINALYSIS, ROUTINE W REFLEX MICROSCOPIC - Abnormal; Notable for the following components:   Color, Urine AMBER (*)    Nitrite POSITIVE (*)    Bacteria, UA MANY (*)    All other components within normal limits  PREGNANCY, URINE    EKG None  Radiology No results found.  Procedures Procedures   Medications Ordered in ED Medications  acetaminophen (TYLENOL) tablet 1,000 mg (1,000 mg Oral Given 03/24/22 0811)  cefTRIAXone (ROCEPHIN) 1 g in sodium chloride 0.9 % 100 mL IVPB (0 g Intravenous Stopped 03/24/22 0844)    ED Course/ Medical Decision Making/ A&P                             Medical Decision Making Risk OTC drugs. Prescription drug management.   Stephanie Morgan is a 22 y.o.  female with comorbidities that complicate the patient evaluation including UTIs who presents to the emergency department with dysuria and back pain  Initial Ddx:  Pyelonephritis, infected kidney stone, UTI  MDM:  Feel the patient likely has urinary tract infection at this time and may have progressed to pyelonephritis with her back pain and CVA tenderness.  Description of her pain does not appear to be consistent with a kidney stone and will obtain urinalysis to check to see if patient has red blood cells that would be indicative of stone.  Plan:  Urinalysis Urine culture reviewed Urine pregnancy  ED Summary/Re-evaluation:  Urinalysis obtained that was nitrite positive with many bacteria.  Feel that she likely does have pyelonephritis but is overall stable and I feel that she is suitable for outpatient treatment.  No blood that would indicate a stone.  Was given a dose of ceftriaxone IV in the emergency department and sent home on Keflex.  Her pregnancy test was pending at the time of discharge and instructed her to check the results on her MyChart.  Also instructed her to follow-up with her primary doctor in several days.  This patient presents to the ED for concern of complaints listed in HPI, this involves an extensive number of treatment options, and is a complaint that carries with it a high risk of complications and morbidity. Disposition including potential need for admission considered.   Dispo: DC Home. Return precautions discussed including, but not limited to, those listed in the AVS. Allowed pt time to ask questions which were answered fully prior to dc.  Additional history obtained from mother Records reviewed ED Visit Notes The following labs were independently interpreted: Urinalysis and show urinary tract infection I have reviewed the patients home medications and made adjustments as needed  Final Clinical Impression(s) / ED Diagnoses Final diagnoses:  Pyelonephritis     Rx / DC Orders ED Discharge Orders          Ordered    cephALEXin (KEFLEX) 500 MG capsule  4 times daily        03/24/22 0823              Fransico Meadow, MD 03/24/22 2032

## 2022-03-24 NOTE — ED Notes (Signed)
Pt in bed, mom at bedside, pt reports decreased pain, states that she is ready to go home, d/c pt iv, cath intact, verbalized understanding d/c and follow up, ambulatory from dpt.

## 2022-03-24 NOTE — ED Provider Notes (Signed)
Went to evaluate patient twice but she was not in her room.   Fransico Meadow, MD 03/24/22 [phone]

## 2022-03-24 NOTE — ED Notes (Signed)
Pt in bed, pt states that she is here for some uti like symptoms with some lower back pain.

## 2022-03-24 NOTE — Discharge Instructions (Addendum)
You were seen for pyelonephritis in the emergency department.   At home, please take the antibiotic we have prescribed you for your urinary tract infection. Take tylenol and ibuprofen for your pain.    Check your MyChart online for the results of any tests that had not resulted by the time you left the emergency department.   Follow-up with your primary doctor in 2-3 days regarding your visit.    Return immediately to the emergency department if you experience any of the following: fevers, worsening flank pain, vomiting, or any other concerning symptoms.    Thank you for visiting our Emergency Department. It was a pleasure taking care of you today.

## 2022-03-25 LAB — URINE CULTURE: Culture: >=100000 — AB

## 2022-03-26 ENCOUNTER — Telehealth (HOSPITAL_BASED_OUTPATIENT_CLINIC_OR_DEPARTMENT_OTHER): Payer: Self-pay | Admitting: *Deleted

## 2022-03-26 NOTE — Telephone Encounter (Signed)
Post ED Visit - Positive Culture Follow-up  Culture report reviewed by antimicrobial stewardship pharmacist: [place] Team []  Elenor Quinones, Pharm.D. []  Heide Guile, Pharm.D., BCPS AQ-ID []  Parks Neptune, Pharm.D., BCPS []  Alycia Rossetti, Pharm.D., BCPS []  [place], [place].D., BCPS, AAHIVP []  Legrand Como, Pharm.D., BCPS, AAHIVP []  Salome Arnt, PharmD, BCPS []  Johnnette Gourd, PharmD, BCPS []  Hughes Better, PharmD, BCPS []  Leeroy Cha, PharmD []  Laqueta Linden, PharmD, BCPS [x]  Erskine Speed, PharmD  [place] Team []  Leodis Sias, PharmD []  Lindell Spar, PharmD []  Royetta Asal, PharmD []  Graylin Shiver, Rph []  Rema Fendt) Glennon Mac, PharmD []  Arlyn Dunning, PharmD []  Netta Cedars, PharmD []  Dia Sitter, PharmD []  Leone Haven, PharmD []  Gretta Arab, PharmD []  Theodis Shove, PharmD []  Peggyann Juba, PharmD []  Reuel Boom, PharmD   Positive urine culture Treated with Cephalexin, organism sensitive to the same and no further patient follow-up is required at this time.  [name] 03/26/2022, 9:25 AM

## 2022-05-29 NOTE — Progress Notes (Unsigned)
   ACUTE VISIT No chief complaint on file.  HPI: Ms.Cassandre Arpin is a 22 y.o. female, who is here today complaining of *** HPI  Review of Systems See other pertinent positives and negatives in HPI.  Current Outpatient Medications on File Prior to Visit  Medication Sig Dispense Refill   meclizine (ANTIVERT) 25 MG tablet Take 1 tablet (25 mg total) by mouth 3 (three) times daily as needed for dizziness. 30 tablet 0   No current facility-administered medications on file prior to visit.    Past Medical History:  Diagnosis Date   Allergy    No Known Allergies  Social History   Socioeconomic History   Marital status: Single    Spouse name: Not on file   Number of children: Not on file   Years of education: Not on file   Highest education level: Not on file  Occupational History   Not on file  Tobacco Use   Smoking status: Never   Smokeless tobacco: Never  Vaping Use   Vaping Use: Never used  Substance and Sexual Activity   Alcohol use: No   Drug use: No   Sexual activity: Yes    Birth control/protection: Condom  Other Topics Concern   Not on file  Social History Narrative   Not on file   Social Determinants of Health   Financial Resource Strain: Not on file  Food Insecurity: Not on file  Transportation Needs: Not on file  Physical Activity: Not on file  Stress: Not on file  Social Connections: Not on file    There were no vitals filed for this visit. There is no height or weight on file to calculate BMI.  Physical Exam  ASSESSMENT AND PLAN: There are no diagnoses linked to this encounter.  No follow-ups on file.  Caretha Rumbaugh G. [place], MD  [facility]. [place] office.  Discharge Instructions   None

## 2022-05-30 ENCOUNTER — Encounter: Payer: Self-pay | Admitting: Family Medicine

## 2022-05-30 ENCOUNTER — Ambulatory Visit (INDEPENDENT_AMBULATORY_CARE_PROVIDER_SITE_OTHER): Payer: PRIVATE HEALTH INSURANCE | Admitting: Family Medicine

## 2022-05-30 VITALS — BP 110/70 | HR 75 | Temp 98.5°F | Resp 12 | Ht 63.0 in | Wt 164.4 lb

## 2022-05-30 DIAGNOSIS — H6992 Unspecified Eustachian tube disorder, left ear: Secondary | ICD-10-CM | POA: Diagnosis not present

## 2022-05-30 DIAGNOSIS — H6502 Acute serous otitis media, left ear: Secondary | ICD-10-CM

## 2022-05-30 MED ORDER — AMOXICILLIN-POT CLAVULANATE 875-125 MG PO TABS: 1.0000 | ORAL_TABLET | Freq: Two times a day (BID) | ORAL | 0 refills | Status: DC

## 2022-05-30 NOTE — Patient Instructions (Addendum)
A few things to remember from today's visit:  Non-recurrent acute serous otitis media of left ear  Dysfunction of left eustachian tube Popping your ears a few times during the day for 1-2 seconds will help. Sudafed 12 hours daily in the morning for 10 days. I think we can hold on antibiotics but if earache gets worse within the next 3 days or not resolved in 5 days you can start Augmentin.  If you need refills for medications you take chronically, please call your pharmacy. Do not use My Chart to request refills or for acute issues that need immediate attention. If you send a my chart message, it may take a few days to be addressed, specially if I am not in the office.  Please be sure medication list is accurate. If a new problem present, please set up appointment sooner than planned today.

## 2022-07-04 ENCOUNTER — Other Ambulatory Visit: Payer: Self-pay

## 2022-07-04 ENCOUNTER — Encounter (HOSPITAL_COMMUNITY): Payer: Self-pay | Admitting: Emergency Medicine

## 2022-07-04 ENCOUNTER — Emergency Department (HOSPITAL_COMMUNITY)
Admission: EM | Admit: 2022-07-04 | Discharge: 2022-07-04 | Disposition: A | Discharge status: Home / Self Care | Payer: No Typology Code available for payment source | Attending: Emergency Medicine | Admitting: Emergency Medicine

## 2022-07-04 DIAGNOSIS — Y9389 Activity, other specified: Secondary | ICD-10-CM | POA: Insufficient documentation

## 2022-07-04 DIAGNOSIS — W228XXA Striking against or struck by other objects, initial encounter: Secondary | ICD-10-CM | POA: Diagnosis not present

## 2022-07-04 DIAGNOSIS — Y990 Civilian activity done for income or pay: Secondary | ICD-10-CM | POA: Diagnosis not present

## 2022-07-04 DIAGNOSIS — S0990XA Unspecified injury of head, initial encounter: Secondary | ICD-10-CM | POA: Diagnosis present

## 2022-07-04 DIAGNOSIS — S060X0A Concussion without loss of consciousness, initial encounter: Secondary | ICD-10-CM | POA: Diagnosis not present

## 2022-07-04 MED ORDER — ONDANSETRON 4 MG PO TBDP: 4.0000 mg | ORAL_TABLET | Freq: Three times a day (TID) | ORAL | 0 refills | Status: DC | PRN
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
## 2022-07-04 NOTE — Discharge Instructions (Addendum)
Your exam today was overall reassuring.  You likely do have a concussion.  I have sent nausea medication into the pharmacy for you.  I have attached information regarding concussion above for you.  If you notice persistent vomiting and not being able to keep anything down, worsening vision, severe headache please return to the emergency department for evaluation.  If your symptoms last longer than a week have attached information regarding the concussion clinic that she can follow-up with.  We did discuss obtaining a CT scan of the head that she decided to defer at this time.  If your symptoms get worse this will likely need to be obtained at that time.
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
## 2022-07-06 ENCOUNTER — Telehealth: Payer: Self-pay

## 2022-07-06 NOTE — Transitions of Care (Post Inpatient/ED Visit) (Signed)
   07/06/2022  Name: Stephanie Morgan MRN: [id number] DOB: June 02, 2000  Today's TOC FU Call Status: Today's TOC FU Call Status:: Unsuccessul Call (1st Attempt) Unsuccessful Call (1st Attempt) Date: 07/06/22  Attempted to reach the patient regarding the most recent Inpatient/ED visit.  Follow Up Plan: Additional outreach attempts will be made to reach the patient to complete the Transitions of Care (Post Inpatient/ED visit) call.   Signature Claudette Laws BSN, Editor, commissioning Primary [facility] [place] Clinical Environmental education officer

## 2022-07-30 NOTE — Progress Notes (Unsigned)
   ACUTE VISIT No chief complaint on file.  HPI: Ms.Shauntee Sweetland is a 22 y.o. female, who is here today complaining of *** HPI  Review of Systems See other pertinent positives and negatives in HPI.  Current Outpatient Medications on File Prior to Visit  Medication Sig Dispense Refill  . amoxicillin-clavulanate (AUGMENTIN) 875-125 MG tablet Take 1 tablet by mouth 2 (two) times daily. 20 tablet 0  . meclizine (ANTIVERT) 25 MG tablet Take 1 tablet (25 mg total) by mouth 3 (three) times daily as needed for dizziness. 30 tablet 0  . ondansetron (ZOFRAN-ODT) 4 MG disintegrating tablet Take 1 tablet (4 mg total) by mouth every 8 (eight) hours as needed for nausea or vomiting. 20 tablet 0   No current facility-administered medications on file prior to visit.    Past Medical History:  Diagnosis Date  . Allergy    No Known Allergies  Social History   Socioeconomic History  . Marital status: Single    Spouse name: Not on file  . Number of children: Not on file  . Years of education: Not on file  . Highest education level: Not on file  Occupational History  . Not on file  Tobacco Use  . Smoking status: Never  . Smokeless tobacco: Never  Vaping Use  . Vaping Use: Never used  Substance and Sexual Activity  . Alcohol use: No  . Drug use: No  . Sexual activity: Yes    Birth control/protection: Condom  Other Topics Concern  . Not on file  Social History Narrative  . Not on file   Social Determinants of Health   Financial Resource Strain: Not on file  Food Insecurity: Not on file  Transportation Needs: Not on file  Physical Activity: Not on file  Stress: Not on file  Social Connections: Not on file    There were no vitals filed for this visit. There is no height or weight on file to calculate BMI.  Physical Exam  ASSESSMENT AND PLAN: There are no diagnoses linked to this encounter.  No follow-ups on file.  Janijah Symons G. [place], MD  [facility]. [place]  office.  Discharge Instructions   None

## 2022-07-31 ENCOUNTER — Ambulatory Visit (INDEPENDENT_AMBULATORY_CARE_PROVIDER_SITE_OTHER): Payer: PRIVATE HEALTH INSURANCE | Admitting: Family Medicine

## 2022-07-31 ENCOUNTER — Encounter: Payer: Self-pay | Admitting: Family Medicine

## 2022-07-31 VITALS — BP 100/60 | HR 64 | Temp 99.1°F | Resp 12 | Ht 63.0 in | Wt 164.1 lb

## 2022-07-31 DIAGNOSIS — N6019 Diffuse cystic mastopathy of unspecified breast: Secondary | ICD-10-CM

## 2022-07-31 DIAGNOSIS — R200 Anesthesia of skin: Secondary | ICD-10-CM | POA: Diagnosis not present

## 2022-07-31 DIAGNOSIS — N63 Unspecified lump in unspecified breast: Secondary | ICD-10-CM

## 2022-07-31 NOTE — Patient Instructions (Addendum)
A few things to remember from today's visit:  Breast lump in female  Fibrocystic breast disease (FCBD), unspecified laterality  Numbness of toes  No suspicious findings today. Continue monitoring for changes. We can recheck thyroid and order B12 next visit if numbness is persistent.  In regard to breast lump is is most likely benign. If pain gets worse or lesion gets persistently bigger, we could consider arranging appt with breast surgeon.  If you need refills for medications you take chronically, please call your pharmacy. Do not use My Chart to request refills or for acute issues that need immediate attention. If you send a my chart message, it may take a few days to be addressed, specially if I am not in the office.  Please be sure medication list is accurate. If a new problem present, please set up appointment sooner than planned today.

## 2023-12-27 ENCOUNTER — Ambulatory Visit (INDEPENDENT_AMBULATORY_CARE_PROVIDER_SITE_OTHER): Payer: PRIVATE HEALTH INSURANCE | Admitting: Family Medicine

## 2023-12-27 VITALS — BP 124/80 | HR 62 | Temp 97.9°F | Resp 16 | Ht 63.0 in | Wt 167.2 lb

## 2023-12-27 DIAGNOSIS — R200 Anesthesia of skin: Secondary | ICD-10-CM

## 2023-12-27 LAB — COMPREHENSIVE METABOLIC PANEL WITH GFR
ALT: 13 U/L (ref 0–35)
AST: 12 U/L (ref 0–37)
Albumin: 4.6 g/dL (ref 3.5–5.2)
Alkaline Phosphatase: 49 U/L (ref 39–117)
BUN: 14 mg/dL (ref 6–23)
CO2: 25 meq/L (ref 19–32)
Calcium: 9.1 mg/dL (ref 8.4–10.5)
Chloride: 103 meq/L (ref 96–112)
Creatinine, Ser: 0.7 mg/dL (ref 0.40–1.20)
GFR: 121.92 mL/min (ref >60.00)
Glucose, Bld: 79 mg/dL (ref 70–99)
Potassium: 4.3 meq/L (ref 3.5–5.1)
Sodium: 137 meq/L (ref 135–145)
Total Bilirubin: 0.6 mg/dL (ref 0.2–1.2)
Total Protein: 7.1 g/dL (ref 6.0–8.3)

## 2023-12-27 LAB — C-REACTIVE PROTEIN: CRP: <0.5 mg/dL (ref 0.5–20.0)

## 2023-12-27 LAB — CBC
HCT: 39.3 % (ref 36.0–46.0)
Hemoglobin: 13.3 g/dL (ref 12.0–15.0)
MCHC: 33.9 g/dL (ref 30.0–36.0)
MCV: 92.6 fl (ref 78.0–100.0)
Platelets: 289 K/uL (ref 150.0–400.0)
RBC: 4.24 Mil/uL (ref 3.87–5.11)
RDW: 12.6 % (ref 11.5–15.5)
WBC: 4.9 K/uL (ref 4.0–10.5)

## 2023-12-27 LAB — TSH: TSH: 2.03 u[IU]/mL (ref 0.35–5.50)

## 2023-12-27 LAB — VITAMIN B12: Vitamin B-12: 436 pg/mL (ref 211–911)

## 2023-12-27 MED ORDER — PREDNISONE 20 MG PO TABS: ORAL_TABLET | ORAL | 0 refills | Status: AC

## 2023-12-27 NOTE — Patient Instructions (Addendum)
 A few things to remember from today's visit:  Numbness of right foot - Plan: Comprehensive metabolic panel with GFR, CBC, TSH, Vitamin B12, C-reactive protein, predniSONE (DELTASONE) 20 MG tablet It could be daily beginning of something, so monitor for new symptoms. If the problem is persistent after a couple weeks, we could consider appointment with neurologist. Monitor for rash.  Take prednisone with breakfast.  Do not use My Chart to request refills or for acute issues that need immediate attention. If you send a my chart message, it may take a few days to be addressed, specially if I am not in the office.  Please be sure medication list is accurate. If a new problem present, please set up appointment sooner than planned today.

## 2023-12-27 NOTE — Progress Notes (Unsigned)
 ACUTE VISIT Chief Complaint  Patient presents with   Acute Visit    Right foot numbness    HPI: [name] is a 23 y.o. female, who is here today complaining of *** HPI Discussed the use of AI scribe software for clinical note transcription with the patient, who gave verbal consent to proceed.  History of Present Illness Stephanie Morgan is a 23 year old female who presents with right foot numbness.  She has been experiencing constant numbness in her right foot, specifically on the lateral side, for the past week. The intensity is rated as 6 out of 10, with the sensation more pronounced on the outer part of her foot.  There have been no prior episodes of similar symptoms, and the numbness has neither improved nor worsened since its onset. No back pain, weakness, or limitation of movement. No recent changes in footwear or activities. She has not tried any over-the-counter treatments or topical applications. No changes in skin color or texture on the affected area.  No fever, chills, abnormal weight loss, or body aches. The numbness occurs both with and without shoes.   Review of Systems See other pertinent positives and negatives in HPI.  No current outpatient medications on file prior to visit.   No current facility-administered medications on file prior to visit.   Past Medical History:  Diagnosis Date   Allergy    No Known Allergies  Social History   Socioeconomic History   Marital status: Single    Spouse name: Not on file   Number of children: Not on file   Years of education: Not on file   Highest education level: Not on file  Occupational History   Not on file  Tobacco Use   Smoking status: Never   Smokeless tobacco: Never  Vaping Use   Vaping status: Never Used  Substance and Sexual Activity   Alcohol use: No   Drug use: No   Sexual activity: Yes    Birth control/protection: Condom  Other Topics Concern   Not on file  Social History Narrative   Not  on file   Social Drivers of Health   Financial Resource Strain: Not on file  Food Insecurity: Not on file  Transportation Needs: Not on file  Physical Activity: Not on file  Stress: Not on file  Social Connections: Not on file   Vitals:   12/27/23 0836  BP: 124/80  Pulse: 62  Temp: 97.9 F (36.6 C)  SpO2: 98%   Body mass index is 29.62 kg/m.  Physical Exam Vitals and nursing note reviewed.  Constitutional:      General: She is not in acute distress.    Appearance: She is well-developed.  HENT:     Head: Normocephalic and atraumatic.     Mouth/Throat:     Mouth: Mucous membranes are moist.     Pharynx: Oropharynx is clear.  Eyes:     Conjunctiva/sclera: Conjunctivae normal.  Cardiovascular:     Rate and Rhythm: Normal rate and regular rhythm.     Pulses:          Dorsalis pedis pulses are 2+ on the right side and 2+ on the left side.     Heart sounds: No murmur heard. Pulmonary:     Effort: Pulmonary effort is normal. No respiratory distress.     Breath sounds: Normal breath sounds.  Abdominal:     Palpations: Abdomen is soft. There is no hepatomegaly or mass.  Tenderness: There is no abdominal tenderness.  Lymphadenopathy:     Cervical: No cervical adenopathy.  Skin:    General: Skin is warm.     Findings: No erythema or rash.  Neurological:     General: No focal deficit present.     Mental Status: She is alert and oriented to person, place, and time.     Gait: Gait normal.     Deep Tendon Reflexes:     Reflex Scores:      Patellar reflexes are 2+ on the right side and 2+ on the left side.      Achilles reflexes are 2+ on the right side and 2+ on the left side.    Comments: Decreased monofilament and vibration on lateral aspect of right foot.  Psychiatric:        Mood and Affect: Mood and affect normal.    ASSESSMENT AND PLAN: Numbness of right foot -     Comprehensive metabolic panel with GFR; Future -     CBC; Future -     TSH; Future -      Vitamin B12; Future -     C-reactive protein; Future -     predniSONE; 2 tabs for 4 days,1 tabs for 3 days, and 1/2 tab for 3 days. Take tables together with breakfast.  Dispense: 13 tablet; Refill: 0   We discussed possible etiologies.*** Neurologic examination today otherwise negative except for mildly decreased sensation and vibration on affected area. Explained that it could be the beginning of a systemic illness, for now I do not think imaging is needed. She agrees with trial of prednisone taper, we discussed some side effects. Monitor for new symptoms. Further recommendation will be given according to lab results. If problem is persistent after 2 to 3 weeks, we can consider neurology evaluation.  Return if symptoms worsen or fail to improve.  Tomothy Eddins G. [place], MD  [facility]. [place] office.

## 2023-12-28 ENCOUNTER — Encounter: Payer: Self-pay | Admitting: Family Medicine

## 2023-12-28 ENCOUNTER — Ambulatory Visit: Payer: Self-pay | Admitting: Family Medicine
